# Patient Record
Sex: Male | Born: 1975 | Race: White | Hispanic: No | Smoking: Former smoker
Health system: Southern US, Community
[De-identification: ages and names within clinical notes are randomized; demographics above are authoritative.]

## PROBLEM LIST (undated history)

## (undated) DIAGNOSIS — J45909 Unspecified asthma, uncomplicated: Secondary | ICD-10-CM

## (undated) DIAGNOSIS — I8390 Asymptomatic varicose veins of unspecified lower extremity: Secondary | ICD-10-CM

## (undated) HISTORY — DX: Unspecified asthma, uncomplicated: J45.909

## (undated) HISTORY — DX: Asymptomatic varicose veins of unspecified lower extremity: I83.90

---

## 2007-04-30 ENCOUNTER — Emergency Department (HOSPITAL_COMMUNITY): Admission: EM | Admit: 2007-04-30 | Discharge: 2007-04-30 | Payer: Self-pay | Admitting: Emergency Medicine

## 2008-11-04 IMAGING — CR DG CHEST 2V
2 series · 2 of 2 positions shown · non-contrast
Comparison: None.

CLINICAL DATA: Shortness of breath, chest pain and cough.

[w chest pa]
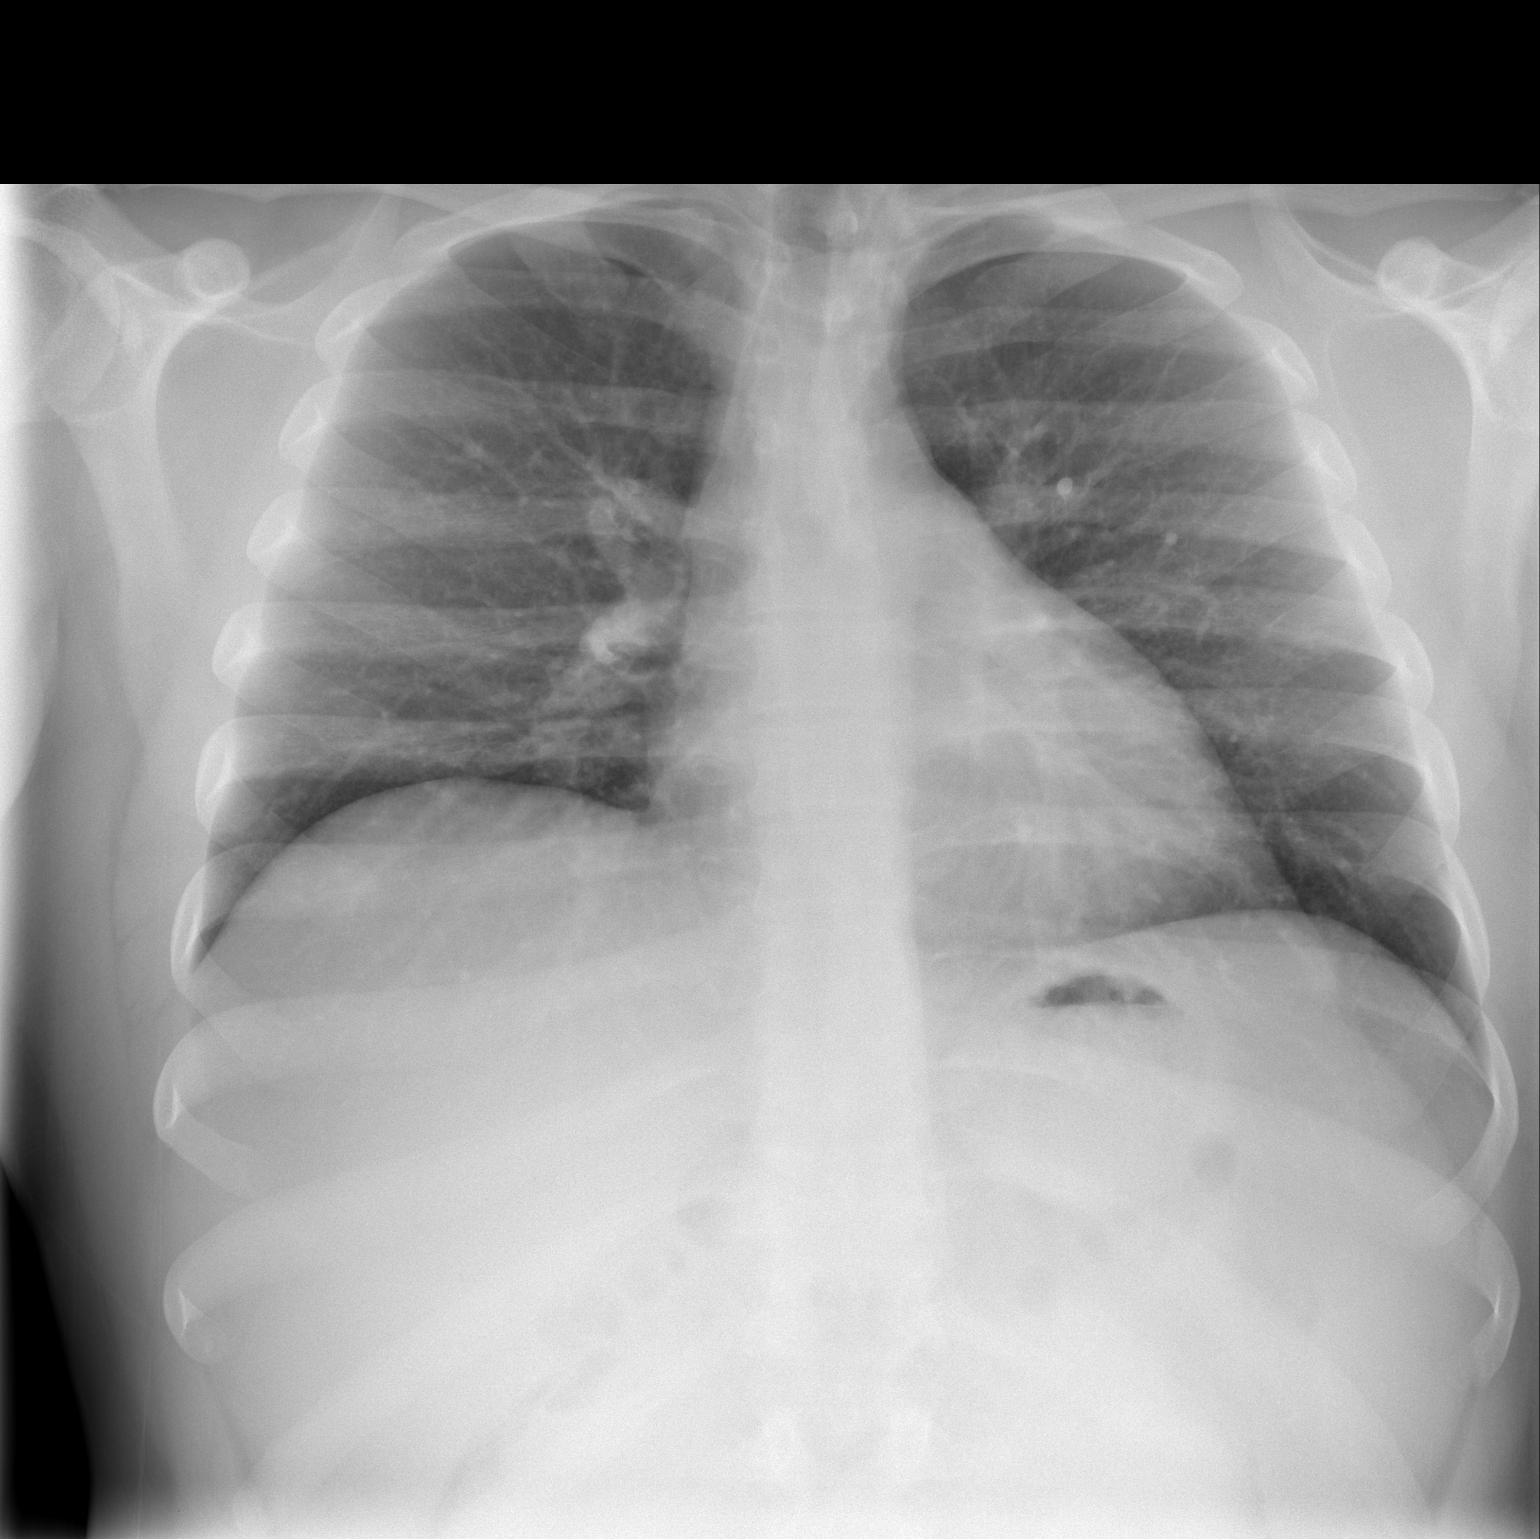

[w chest lat]
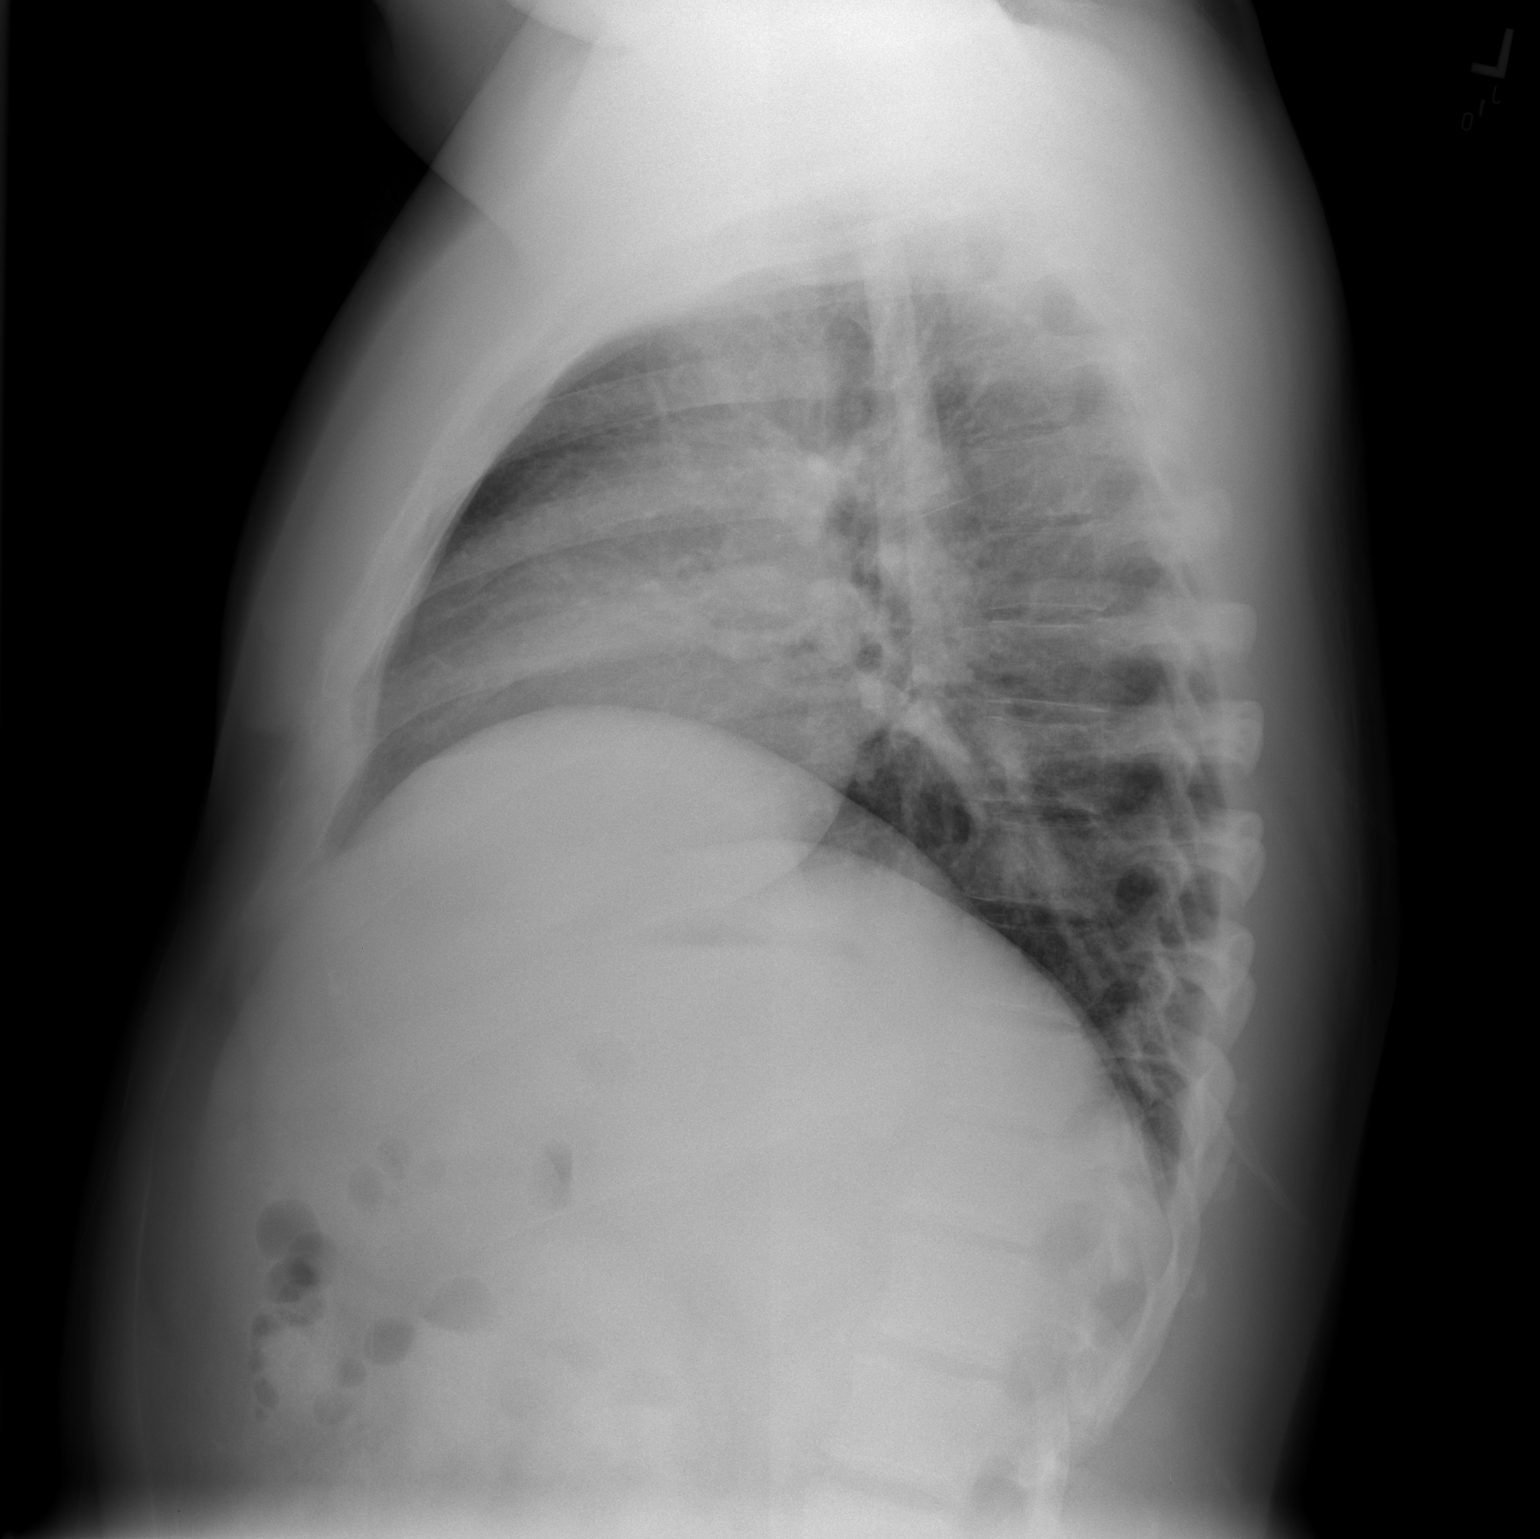

[2 of 2 positions shown; findings below may reference images not displayed]

FINDINGS: Lung volumes are low with crowding of the bronchovascular structures.   No focal airspace disease or effusion.  Heart size normal.  No focal bony abnormality.
IMPRESSION: No acute disease.

## 2009-02-13 ENCOUNTER — Ambulatory Visit: Payer: Self-pay | Admitting: Family Medicine

## 2009-02-13 DIAGNOSIS — R51 Headache: Secondary | ICD-10-CM

## 2009-02-13 DIAGNOSIS — Z87898 Personal history of other specified conditions: Secondary | ICD-10-CM

## 2009-02-13 DIAGNOSIS — Z8719 Personal history of other diseases of the digestive system: Secondary | ICD-10-CM | POA: Insufficient documentation

## 2009-02-13 DIAGNOSIS — R519 Headache, unspecified: Secondary | ICD-10-CM | POA: Insufficient documentation

## 2009-02-13 DIAGNOSIS — G4700 Insomnia, unspecified: Secondary | ICD-10-CM

## 2009-02-13 LAB — CONVERTED CEMR LAB
Bilirubin Urine: NEGATIVE
Glucose, Urine, Semiquant: NEGATIVE
Protein, U semiquant: NEGATIVE
Specific Gravity, Urine: 1.01
pH: 7

## 2009-02-16 ENCOUNTER — Encounter (INDEPENDENT_AMBULATORY_CARE_PROVIDER_SITE_OTHER): Payer: Self-pay | Admitting: *Deleted

## 2009-02-16 LAB — CONVERTED CEMR LAB
Basophils Absolute: 0 10*3/uL (ref 0.0–0.1)
Chlamydia, Swab/Urine, PCR: NEGATIVE
GC Probe Amp, Urine: NEGATIVE
HCT: 47.8 % (ref 39.0–52.0)
Lymphocytes Relative: 31 % (ref 12–46)
Lymphs Abs: 2.4 10*3/uL (ref 0.7–4.0)
Neutro Abs: 4.5 10*3/uL (ref 1.7–7.7)
Neutrophils Relative %: 59 % (ref 43–77)
PSA: 1.18 ng/mL (ref 0.10–4.00)
Platelets: 198 10*3/uL (ref 150–400)
WBC: 7.7 10*3/uL (ref 4.0–10.5)

## 2009-02-19 ENCOUNTER — Ambulatory Visit: Payer: Self-pay | Admitting: Family Medicine

## 2009-02-19 DIAGNOSIS — F17200 Nicotine dependence, unspecified, uncomplicated: Secondary | ICD-10-CM

## 2009-02-25 ENCOUNTER — Encounter: Payer: Self-pay | Admitting: Family Medicine

## 2009-03-02 ENCOUNTER — Ambulatory Visit: Payer: Self-pay | Admitting: Family Medicine

## 2009-03-02 LAB — CONVERTED CEMR LAB
Nitrite: NEGATIVE
Specific Gravity, Urine: 1.01
Urobilinogen, UA: 0.2
WBC Urine, dipstick: NEGATIVE

## 2009-03-09 LAB — CONVERTED CEMR LAB
AST: 29 units/L (ref 0–37)
Albumin: 4.3 g/dL (ref 3.5–5.2)
Alkaline Phosphatase: 51 units/L (ref 39–117)
BUN: 9 mg/dL (ref 6–23)
Bilirubin, Direct: 0.1 mg/dL (ref 0.0–0.3)
Cholesterol: 196 mg/dL (ref 0–200)
Creatinine, Ser: 1.1 mg/dL (ref 0.4–1.5)
GFR calc non Af Amer: 81.39 mL/min (ref >60)
Glucose, Bld: 90 mg/dL (ref 70–99)
Total CHOL/HDL Ratio: 5
Total Protein: 7.2 g/dL (ref 6.0–8.3)

## 2013-10-29 ENCOUNTER — Other Ambulatory Visit: Payer: Self-pay | Admitting: *Deleted

## 2013-10-29 DIAGNOSIS — I83893 Varicose veins of bilateral lower extremities with other complications: Secondary | ICD-10-CM

## 2013-11-25 ENCOUNTER — Encounter: Payer: Self-pay | Admitting: Vascular Surgery

## 2013-11-26 ENCOUNTER — Encounter: Payer: Self-pay | Admitting: Vascular Surgery

## 2013-11-26 ENCOUNTER — Ambulatory Visit (INDEPENDENT_AMBULATORY_CARE_PROVIDER_SITE_OTHER): Payer: Medicaid Other | Admitting: Vascular Surgery

## 2013-11-26 ENCOUNTER — Ambulatory Visit (HOSPITAL_COMMUNITY)
Admission: RE | Admit: 2013-11-26 | Discharge: 2013-11-26 | Disposition: A | Discharge status: Home / Self Care | Payer: Medicaid Other | Source: Ambulatory Visit | Attending: Vascular Surgery | Admitting: Vascular Surgery

## 2013-11-26 VITALS — BP 121/79 | HR 62 | Resp 16 | Ht 69.0 in | Wt 233.0 lb

## 2013-11-26 DIAGNOSIS — I83893 Varicose veins of bilateral lower extremities with other complications: Secondary | ICD-10-CM | POA: Insufficient documentation

## 2013-11-26 NOTE — Progress Notes (Signed)
Subjective:     Patient ID: Frank Ramirez, male   DOB: 03/10/1976, 38 y.o.   MRN: [id number]  HPI this 38 year-old male is evaluated for painful varicosities in the right leg. He has noted varicosities in the right medial thigh and calf area and prominent veins in the right ankle over the past few years. He has developed increasing pain and swelling in the right ankle as the day progresses. He does not wear elastic compression stockings. He has no history of DVT thrombophlebitis stasis ulcers or bleeding. His symptoms are affecting his daily living at the present time.  Past Medical History  Diagnosis Date  . Asthma   . Varicose veins     History  Substance Use Topics  . Smoking status: Former Smoker    Quit date: 11/25/2013  . Smokeless tobacco: Not on file  . Alcohol Use: 4.8 oz/week    8 Shots of liquor per week    History reviewed. No pertinent family history.  No Known Allergies  Current outpatient prescriptions:albuterol (PROVENTIL, VENTOLIN) (5 MG/ML) 0.5% NEBU, Take by nebulization continuous., Disp: , Rfl: ;  buPROPion (WELLBUTRIN) 75 MG tablet, Take 75 mg by mouth 2 (two) times daily., Disp: , Rfl:   BP 121/79  Pulse 62  Resp 16  Ht 5\' 9"  (1.753 m)  Wt 233 lb (105.688 kg)  BMI 34.39 kg/m2  Body mass index is 34.39 kg/(m^2).           Review of Systems denies chest pain, dyspnea on exertion, PND, orthopnea, hemoptysis. Does have pain in legs with walking, asthma, psoriasis. Other systems negative and a complete review of systems    Objective:   Physical Exam BP 121/79  Pulse 62  Resp 16  Ht 5\' 9"  (1.753 m)  Wt 233 lb (105.688 kg)  BMI 34.39 kg/m2  Gen.-alert and oriented x3 in no apparent distress HEENT normal for age Lungs no rhonchi or wheezing Cardiovascular regular rhythm no murmurs carotid pulses 3+ palpable no bruits audible Abdomen soft nontender no palpable masses Musculoskeletal free of  major deformities Skin clear -no  rashes Neurologic normal Lower extremities 3+ femoral and dorsalis pedis pulses palpable bilaterally with 1+ edema on the right no edema on the left Right leg has bulging varicosities in the medial calf area over the great saphenous system with prominent reticular veins posterior to the right medial malleolus. No active ulceration is noted. Left leg has some early varicosities in the medial calf below the knee.  Today I ordered venous duplex exam of the right leg which are reviewed and interpreted. There is gross reflux throughout the right great saphenous vein which supplies his bulging varicosities and there is no DVT but some deep reflexes present.        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    Plan:         #1 long leg elastic compression stockings 20-30 mm gradient #2 elevate legs as much as possible #3 ibuprofen daily on a regular basis for pain #4 return in 3 months-if no significant improvement then he will need laser blush in right great saphenous vein with multiple stab phlebectomy Patient will also need to reactivate  his Medicaid coverage which is due to expire in September of 2015

## 2014-03-03 ENCOUNTER — Encounter: Payer: Self-pay | Admitting: Vascular Surgery

## 2014-03-04 ENCOUNTER — Encounter: Payer: Self-pay | Admitting: Vascular Surgery

## 2014-03-04 ENCOUNTER — Ambulatory Visit (INDEPENDENT_AMBULATORY_CARE_PROVIDER_SITE_OTHER): Payer: Medicaid Other | Admitting: Vascular Surgery

## 2014-03-04 ENCOUNTER — Other Ambulatory Visit: Payer: Self-pay | Admitting: *Deleted

## 2014-03-04 VITALS — BP 138/88 | HR 60 | Resp 14 | Ht 69.0 in | Wt 243.0 lb

## 2014-03-04 DIAGNOSIS — I83891 Varicose veins of right lower extremities with other complications: Secondary | ICD-10-CM

## 2014-03-04 NOTE — Progress Notes (Signed)
Subjective:     Patient ID: Frank ReiningWilliam J Ramirez, male   DOB: 01/11/1976, 38 y.o.   MRN: [id number]  HPIthis 38 year old male returns for continued follow-up regarding his painful varicosities in the right leg. He has tried wearing a long-leg elastic compression stockings 20-30 mm gradient as well as elevation ibuprofen with no success. He continues to have aching throbbing burning and itching discomfort in the leg which worsens as the day progresses. He also developed swelling in the ankle area. He has no history of DVT thrombophlebitis stasis ulcers or bleeding. He has demonstrated gross reflux in the right great saphenous vein.  Past Medical History  Diagnosis Date  . Asthma   . Varicose veins     History  Substance Use Topics  . Smoking status: Former Smoker    Quit date: 11/25/2013  . Smokeless tobacco: Never Used  . Alcohol Use: 4.8 oz/week    8 Shots of liquor per week    No family history on file.  No Known Allergies  Current outpatient prescriptions: albuterol (PROVENTIL, VENTOLIN) (5 MG/ML) 0.5% NEBU, Take by nebulization continuous., Disp: , Rfl: ;  buPROPion (WELLBUTRIN) 75 MG tablet, Take 75 mg by mouth 2 (two) times daily., Disp: , Rfl: ;  HYDROcodone-acetaminophen (NORCO) 10-325 MG per tablet, Take 1 tablet by mouth every 4 (four) hours as needed., Disp: , Rfl:   BP 138/88 mmHg  Pulse 60  Resp 14  Ht 5\' 9"  (1.753 m)  Wt 243 lb (110.224 kg)  BMI 35.87 kg/m2  Body mass index is 35.87 kg/(m^2).           Review of Systems Denies chest pain, dyspnea on exertion, PND, orthopnea, hemoptysis.     Objective:   Physical Exam BP 138/88 mmHg  Pulse 60  Resp 14  Ht 5\' 9"  (1.753 m)  Wt 243 lb (110.224 kg)  BMI 35.87 kg/m2  Gen. Well-developed well-nourished male in no apparent distress alert and oriented 3 Lungs no rhonchi or wheezing Right leg with bulging varicosities in the medial distal thigh medial calf extending down to the ankle and into lateral calf. No  hyperpigmentation or ulceration is noted.     Assessment:     Painful varicosities right leg due to gross reflux right great saphenous vein causing symptoms which are affecting patient's daily living. These are resistant to conservative measures including long-leg elastic compression stockings, elevation, and ibuprofen.     Plan:     Patient needs laser ablation right great saphenous vein with greater than 20 stab phlebectomy of painful varicosities Will proceed in the near future

## 2014-03-28 ENCOUNTER — Encounter: Payer: Self-pay | Admitting: Vascular Surgery

## 2014-03-31 ENCOUNTER — Ambulatory Visit (INDEPENDENT_AMBULATORY_CARE_PROVIDER_SITE_OTHER): Payer: Medicaid Other | Admitting: Vascular Surgery

## 2014-03-31 ENCOUNTER — Encounter: Payer: Self-pay | Admitting: Vascular Surgery

## 2014-03-31 VITALS — BP 104/68 | HR 71 | Resp 16 | Ht 69.0 in | Wt 240.0 lb

## 2014-03-31 DIAGNOSIS — I83891 Varicose veins of right lower extremities with other complications: Secondary | ICD-10-CM

## 2014-03-31 NOTE — Progress Notes (Signed)
   Laser Ablation Procedure      Date: 03/31/2014    Frank ReiningWilliam J Lagman DOB:11/01/1975  Consent signed: Yes  Surgeon:J.D. Hart RochesterLawson  Procedure: Laser Ablation: right Greater Saphenous Vein  BP 104/68 mmHg  Pulse 71  Resp 16  Ht 5\' 9"  (1.753 m)  Wt 240 lb (108.863 kg)  BMI 35.43 kg/m2  Start time: 1   End time: 2:10  Tumescent Anesthesia: 450 cc 0.9% NaCl with 50 cc Lidocaine HCL with 1% Epi and 15 cc 8.4% NaHCO3  Local Anesthesia: 7 cc Lidocaine HCL and NaHCO3 (ratio 2:1)   15 watts, 500ms delay, 1.0 duration Total energy: 1407, total pulses: 95, total time: 1:34     Stab Phlebectomy: >20 Sites: Thigh and Calf  Patient tolerated procedure well: Yes  Notes:   Description of Procedure:  After marking the course of the secondary varicosities, the patient was placed on the operating table in the supine position, and the right leg was prepped and draped in sterile fashion.   Local anesthetic was administered and under ultrasound guidance the saphenous vein was accessed with a micro needle and guide wire; then the mirco puncture sheath was place.  A guide wire was inserted saphenofemoral junction , followed by a 5 french sheath.  The position of the sheath and then the laser fiber below the junction was confirmed using the ultrasound.  Tumescent anesthesia was administered along the course of the saphenous vein using ultrasound guidance. The patient was placed in Trendelenburg position and protective laser glasses were placed on patient and staff, and the laser was fired at 15 watt pulsed mode advancing 1-2 mm per sec for a total of 1407 joules.   For stab phlebectomies, local anesthetic was administered at the previously marked varicosities, and tumescent anesthesia was administered around the vessels.  Greater than 20 stab wounds were made using the tip of an 11 blade. And using the vein hook, the phlebectomies were performed using a hemostat to avulse the varicosities.  Adequate  hemostasis was achieved.     Steri strips were applied to the stab wounds and ABD pads and thigh high compression stockings were applied.  Ace wrap bandages were applied over the phlebectomy sites and at the top of the saphenofemoral junction. Blood loss was less than 15 cc.  The patient ambulated out of the operating room having tolerated the procedure well.

## 2014-03-31 NOTE — Progress Notes (Signed)
Subjective:     Patient ID: Frank Ramirez, male   DOB: 04/08/1976, 38 y.o.   MRN: [id number]  HPI this 38 year old male had laser ablation of the right great saphenous vein plus greater than 20 stab phlebectomy of painful varicosities performed under local tumescent anesthesia. He tolerated the procedure well. Total of 1405 J of energy was utilized.   Review of Systems     Objective:   Physical Exam BP 104/68 mmHg  Pulse 71  Resp 16  Ht 5\' 9"  (1.753 m)  Wt 240 lb (108.863 kg)  BMI 35.43 kg/m2       Assessment:     Well-tolerated laser ablation right great saphenous vein plus greater than 20 stab phlebectomy of painful varicosities performed under local tumescent anesthesia    Plan:     Return in one week for venous duplex exams confirm closure right great saphenous vein. This will conclude his treatment regimen.

## 2014-04-01 ENCOUNTER — Telehealth: Payer: Self-pay | Admitting: *Deleted

## 2014-04-01 NOTE — Telephone Encounter (Signed)
Pt doing better today since using ice. Following all the other instructions. No bleeding so told him he could loosen the ace wraps if he needs to. Reminded him of his fu appts.

## 2014-04-04 ENCOUNTER — Encounter: Payer: Self-pay | Admitting: Vascular Surgery

## 2014-04-07 ENCOUNTER — Encounter: Payer: Self-pay | Admitting: Vascular Surgery

## 2014-04-07 ENCOUNTER — Ambulatory Visit (INDEPENDENT_AMBULATORY_CARE_PROVIDER_SITE_OTHER): Payer: Self-pay | Admitting: Vascular Surgery

## 2014-04-07 ENCOUNTER — Ambulatory Visit (HOSPITAL_COMMUNITY)
Admission: RE | Admit: 2014-04-07 | Discharge: 2014-04-07 | Disposition: A | Discharge status: Home / Self Care | Payer: Medicaid Other | Source: Ambulatory Visit | Attending: Vascular Surgery | Admitting: Vascular Surgery

## 2014-04-07 VITALS — BP 135/88 | HR 83 | Resp 16 | Ht 69.0 in | Wt 240.0 lb

## 2014-04-07 DIAGNOSIS — I83891 Varicose veins of right lower extremities with other complications: Secondary | ICD-10-CM | POA: Diagnosis not present

## 2014-04-07 NOTE — Progress Notes (Signed)
Subjective:     Patient ID: Frank Ramirez, male   DOB: 05/04/1975, 38 y.o.   MRN: [id number]  HPI this 38 year old male returns 1 week post laser ablation right great saphenous vein plus greater than 20 stab phlebectomy of painful varicosities. He has worn his elastic compression stocking as instructed and taken ibuprofen. He has had a mild to moderate amount of discomfort. He has not noticed any worsening of distal edema. He denies any chest pain, dyspnea on exertion, hemoptysis, or claudication.   Review of Systems     Objective:   Physical Exam BP 135/88 mmHg  Pulse 83  Resp 16  Ht 5\' 9"  (1.753 m)  Wt 240 lb (108.863 kg)  BMI 35.43 kg/m2  Gen. well-developed well-nourished male no apparent stress alert and oriented 3 Lungs no rhonchi or wheezing Right leg with moderate ecchymosis in mid to distal thigh medially and mild tenderness to palpation over great saphenous vein in the proximal half of the thigh where it was ablated. No distal edema noted. Stab phlebectomy sites healing well. 3+ dorsalis pedis pulse palpable.  Today I ordered a venous duplex exam of the right leg which I reviewed and interpreted. There is no DVT. There is total closure of the great saphenous vein from near the saphenofemoral junction to the mid thigh.     Assessment:     Successful laser ablation right great saphenous vein with multiple stab phlebectomy of painful varicosities    Plan:     Return to see me on when necessary basis

## 2015-03-02 ENCOUNTER — Other Ambulatory Visit: Payer: Medicaid Other | Admitting: Vascular Surgery

## 2020-02-17 DEATH — deceased
